# Patient Record
Sex: Male | Born: 1970 | Race: White | Hispanic: No | Marital: Married | Smoking: Never smoker
Health system: Southern US, Community
[De-identification: ages and names within clinical notes are randomized; demographics above are authoritative.]

## PROBLEM LIST (undated history)

## (undated) DIAGNOSIS — M25511 Pain in right shoulder: Secondary | ICD-10-CM

## (undated) DIAGNOSIS — G8929 Other chronic pain: Secondary | ICD-10-CM

## (undated) HISTORY — DX: Other chronic pain: G89.29

## (undated) HISTORY — PX: REPLACEMENT DISC ANTERIOR LUMBAR SPINE: SUR1215

## (undated) HISTORY — PX: LUMBAR DISC SURGERY: SHX700

## (undated) HISTORY — DX: Pain in right shoulder: M25.511

---

## 2016-08-29 ENCOUNTER — Ambulatory Visit (INDEPENDENT_AMBULATORY_CARE_PROVIDER_SITE_OTHER): Payer: PRIVATE HEALTH INSURANCE

## 2016-08-29 ENCOUNTER — Ambulatory Visit
Admission: EM | Admit: 2016-08-29 | Discharge: 2016-08-29 | Disposition: A | Discharge status: Home / Self Care | Payer: PRIVATE HEALTH INSURANCE | Attending: Family | Admitting: Family

## 2016-08-29 DIAGNOSIS — M546 Pain in thoracic spine: Secondary | ICD-10-CM

## 2016-08-29 DIAGNOSIS — R0789 Other chest pain: Secondary | ICD-10-CM | POA: Diagnosis not present

## 2016-08-29 MED ORDER — KETOROLAC TROMETHAMINE 60 MG/2ML IM SOLN
60.0000 mg | Freq: Once | INTRAMUSCULAR | Status: AC
  Administered 2016-08-29: 60 mg via INTRAMUSCULAR

## 2016-08-29 MED ORDER — NAPROXEN 500 MG PO TABS: 500.0000 mg | ORAL_TABLET | Freq: Two times a day (BID) | ORAL | 0 refills | Status: DC | PRN

## 2016-08-29 NOTE — ED Triage Notes (Addendum)
Pt with right chest pain and under scapula. Pain 7/10 and worse with movement and is positional. Denies associated sx. Denies recent illness or injury. Pain is sharp in nature and more tender to touch

## 2016-08-29 NOTE — ED Provider Notes (Signed)
CSN: [id number]     Arrival date & time 08/29/16  0831 History   First MD Initiated Contact with Patient 08/29/16 [id number]     Chief Complaint  Patient presents with  . Chest Pain  . Back Pain   (Consider location/radiation/quality/duration/timing/severity/associated sxs/prior Treatment) 46 year old male presents with right sided chest pain that is traveling to his back, just under his scapula. Pain started about 2 to 3 days ago and is getting worse. No distinct injury. Pain worse when moving or stretching area.  No fever, URI symptoms, cough or GI symptoms. Has not taken any medication for symptoms. No history of cardiac or lung disease. However, he does use smokeless tobacco. No other chronic health issues. Takes no daily medication.    The history is provided by the patient.    History reviewed. No pertinent past medical history. Past Surgical History:  Procedure Laterality Date  . LUMBAR DISC SURGERY     History reviewed. No pertinent family history. Social History  Substance Use Topics  . Smoking status: Never Smoker  . Smokeless tobacco: Current User    Types: Snuff  . Alcohol use Yes     Comment: social    Review of Systems  Constitutional: Negative for chills, fatigue and fever.  HENT: Negative for congestion and sore throat.   Eyes: Negative for visual disturbance.  Respiratory: Negative for cough, chest tightness, shortness of breath and wheezing.   Cardiovascular: Positive for chest pain. Negative for palpitations and leg swelling.  Gastrointestinal: Negative for abdominal pain, constipation, diarrhea, nausea and vomiting.  Genitourinary: Negative for difficulty urinating.  Musculoskeletal: Positive for back pain. Negative for arthralgias, joint swelling, neck pain and neck stiffness.  Skin: Negative for color change, rash and wound.  Neurological: Negative for dizziness, syncope, weakness, light-headedness, numbness and headaches.  Hematological: Negative for  adenopathy.    Allergies  Penicillins  Home Medications   Prior to Admission medications   Medication Sig Start Date End Date Taking? Authorizing Provider  naproxen (NAPROSYN) 500 MG tablet Take 1 tablet (500 mg total) by mouth 2 (two) times daily as needed for moderate pain. 08/29/16   Sudie Grumbling, NP   Meds Ordered and Administered this Visit   Medications  ketorolac (TORADOL) injection 60 mg (60 mg Intramuscular Given 08/29/16 1014)    BP (!) 156/89 (BP Location: Left Arm)   Pulse 71   Temp 97.7 F (36.5 C) (Oral)   Resp 18   Ht 6' (1.829 m)   Wt 230 lb (104.3 kg)   SpO2 99%   BMI 31.19 kg/m  No data found.   Physical Exam  Constitutional: He is oriented to person, place, and time. He appears well-developed and well-nourished. He does not appear ill. No distress.  HENT:  Head: Normocephalic and atraumatic.  Nose: Nose normal.  Mouth/Throat: Oropharynx is clear and moist.  Eyes: Conjunctivae and EOM are normal.  Neck: Normal range of motion. Neck supple.  Cardiovascular: Normal rate, regular rhythm and normal heart sounds.   No murmur heard. Pulmonary/Chest: Effort normal and breath sounds normal. No accessory muscle usage. No tachypnea. No respiratory distress. He has no decreased breath sounds. He has no wheezes. He has no rhonchi.    No tenderness to palpation. No rash or other lesions present. Pain does increase with movement and raising right arm and shoulder.   Musculoskeletal: Normal range of motion. He exhibits no tenderness.       Thoracic back: He exhibits pain. He exhibits  normal range of motion, no tenderness, no swelling, no edema, no deformity and no spasm.       Back:  Lymphadenopathy:    He has no cervical adenopathy.  Neurological: He is alert and oriented to person, place, and time. He has normal strength. No sensory deficit.  Skin: Skin is warm and dry. No rash noted. No erythema.  Psychiatric: He has a normal mood and affect. His behavior  is normal. Judgment and thought content normal.    Urgent Care Course     Procedures (including critical care time)  Labs Review Labs Reviewed - No data to display  Imaging Review Dg Chest 2 View  Result Date: 08/29/2016 CLINICAL DATA:  Right chest pain for 3 days. EXAM: CHEST  2 VIEW COMPARISON:  None. FINDINGS: Heart and mediastinal contours are within normal limits. No focal opacities or effusions. No acute bony abnormality. IMPRESSION: No active cardiopulmonary disease. Electronically Signed   By: Charlett NoseKevin  Dover M.D.   On: 08/29/2016 09:45     Visual Acuity Review  Right Eye Distance:   Left Eye Distance:   Bilateral Distance:    Right Eye Near:   Left Eye Near:    Bilateral Near:         MDM   1. Chest wall pain    Reviewed negative x-ray results with patient. Discussed performing an EKG but patient declines- minimal suspicion of cardiac cause of pain. Discussed that the pain is most likely musculoskeletal in origin, especially since pain increases with movement- will treat with anti-inflammatories. Recommend Toradol 60mg  IM now. May then take Naproxen 500mg  every 12 hours as needed. May apply warm compresses to area for comfort. Rest. Follow-up in 2 to 3 days if not improving or go to ER if pain worsens and any shortness of breath, dizziness, vision changes or increased fatigue occurs.    Sudie GrumblingAnn Berry Shevon Sian, NP 08/29/16 [id number]

## 2016-08-29 NOTE — Discharge Instructions (Signed)
You were given a shot of Toradol today to help with pain and inflammation. You may take Naproxen twice a day as needed for pain. Rest. May apply warm compresses to area for comfort. Recommend follow-up in 2 to 3 days if not improving or go to ER if pain worsens.

## 2016-09-03 ENCOUNTER — Ambulatory Visit (INDEPENDENT_AMBULATORY_CARE_PROVIDER_SITE_OTHER): Payer: PRIVATE HEALTH INSURANCE | Admitting: Family Medicine

## 2016-09-03 ENCOUNTER — Encounter: Payer: Self-pay | Admitting: Family Medicine

## 2016-09-03 VITALS — BP 120/88 | HR 80 | Ht 72.0 in | Wt 230.0 lb

## 2016-09-03 DIAGNOSIS — R5383 Other fatigue: Secondary | ICD-10-CM

## 2016-09-03 DIAGNOSIS — Z7689 Persons encountering health services in other specified circumstances: Secondary | ICD-10-CM

## 2016-09-03 DIAGNOSIS — R6882 Decreased libido: Secondary | ICD-10-CM | POA: Diagnosis not present

## 2016-09-03 DIAGNOSIS — R5381 Other malaise: Secondary | ICD-10-CM | POA: Diagnosis not present

## 2016-09-03 NOTE — Patient Instructions (Signed)

## 2016-09-03 NOTE — Progress Notes (Signed)
Name: Evan Krueger   MRN: [id number]    DOB: 02/22/1971   Date:09/03/2016       Progress Note  Subjective  Chief Complaint  Chief Complaint  Patient presents with  . Establish Care  . libido    low sex drive    Thyroid Problem  Presents for initial visit. Symptoms include anxiety and visual change. Patient reports no cold intolerance, constipation, depressed mood, diaphoresis, diarrhea, dry skin, fatigue, hair loss, heat intolerance, hoarse voice, leg swelling, nail problem, palpitations, tremors, weight gain or weight loss. There is no history of atrial fibrillation, diabetes, heart failure or neuropathy. Risk factors include family history of thyroid cancer, family history of goiter, family history of hyperthyroidism, family history of hypothyroidism and radiation exposure.    No problem-specific Assessment & Plan notes found for this encounter.   Past Medical History:  Diagnosis Date  . Chronic right shoulder pain     Past Surgical History:  Procedure Laterality Date  . LUMBAR DISC SURGERY    . REPLACEMENT DISC ANTERIOR LUMBAR SPINE     L5- S1    Family History  Problem Relation Age of Onset  . Heart disease Father   . Cancer Maternal Grandfather   . Cancer Paternal Grandfather     Social History   Social History  . Marital status: Married    Spouse name: N/A  . Number of children: N/A  . Years of education: N/A   Occupational History  . Not on file.   Social History Main Topics  . Smoking status: Never Smoker  . Smokeless tobacco: Current User    Types: Snuff  . Alcohol use Yes     Comment: social  . Drug use: No  . Sexual activity: Yes   Other Topics Concern  . Not on file   Social History Narrative  . No narrative on file    Allergies  Allergen Reactions  . Penicillins Swelling    Outpatient Medications Prior to Visit  Medication Sig Dispense Refill  . naproxen (NAPROSYN) 500 MG tablet Take 1 tablet (500 mg total) by mouth 2 (two) times  daily as needed for moderate pain. (Patient not taking: Reported on 09/03/2016) 20 tablet 0   No facility-administered medications prior to visit.     Review of Systems  Constitutional: Negative for chills, diaphoresis, fatigue, fever, malaise/fatigue, weight gain and weight loss.  HENT: Negative for congestion, ear discharge, ear pain, hearing loss, hoarse voice, nosebleeds, sinus pain, sore throat and tinnitus.   Eyes: Negative for blurred vision, double vision, photophobia, pain and discharge.  Respiratory: Negative for cough, hemoptysis, sputum production, shortness of breath, wheezing and stridor.   Cardiovascular: Negative for chest pain, palpitations, orthopnea, claudication, leg swelling and PND.  Gastrointestinal: Negative for abdominal pain, blood in stool, constipation, diarrhea, heartburn, melena, nausea and vomiting.  Genitourinary: Negative for dysuria, flank pain, frequency, hematuria and urgency.  Musculoskeletal: Negative for back pain, joint pain, myalgias and neck pain.  Skin: Positive for itching and rash.  Neurological: Negative for dizziness, tingling, tremors, sensory change, focal weakness, seizures, loss of consciousness, weakness and headaches.  Endo/Heme/Allergies: Negative for environmental allergies, cold intolerance, heat intolerance and polydipsia. Does not bruise/bleed easily.  Psychiatric/Behavioral: Negative for depression, memory loss and suicidal ideas. The patient is nervous/anxious. The patient does not have insomnia.        No decrease intrteast     Objective  Vitals:   09/03/16 1440  BP: 120/88  Pulse: 80  Weight: 230  lb (104.3 kg)  Height: 6' (1.829 m)    Physical Exam  Constitutional: He is oriented to person, place, and time and well-developed, well-nourished, and in no distress.  HENT:  Head: Normocephalic.  Right Ear: Tympanic membrane, external ear and ear canal normal.  Left Ear: Tympanic membrane, external ear and ear canal normal.    Nose: Nose normal.  Mouth/Throat: Oropharynx is clear and moist.  Eyes: Conjunctivae and EOM are normal. Pupils are equal, round, and reactive to light. Right eye exhibits no discharge. Left eye exhibits no discharge. No scleral icterus.  Neck: Normal range of motion. Neck supple. Normal carotid pulses, no hepatojugular reflux and no JVD present. Carotid bruit is not present. No tracheal deviation present. No thyroid mass and no thyromegaly present.  Cardiovascular: Normal rate, regular rhythm, normal heart sounds and intact distal pulses.  Exam reveals no gallop and no friction rub.   No murmur heard. Pulmonary/Chest: Breath sounds normal. No respiratory distress. He has no wheezes. He has no rales.  Abdominal: Soft. Bowel sounds are normal. He exhibits no mass. There is no hepatosplenomegaly. There is no tenderness. There is no rebound, no guarding and no CVA tenderness.  Musculoskeletal: Normal range of motion. He exhibits no edema or tenderness.  Lymphadenopathy:       Head (right side): No submandibular adenopathy present.       Head (left side): No submandibular adenopathy present.    He has no cervical adenopathy.    He has no axillary adenopathy.  Neurological: He is alert and oriented to person, place, and time. He has normal sensation, normal strength and intact cranial nerves. No cranial nerve deficit.  Skin: Skin is warm. Rash noted. Rash is macular.     Psychiatric: Mood and affect normal.  Nursing note and vitals reviewed.     Assessment & Plan  Problem List Items Addressed This Visit    None    Visit Diagnoses    Encounter to establish care    -  Primary   Malaise and fatigue       Relevant Orders   CBC   Renal Function Panel   Testosterone, Free, Total, SHBG   TSH   Decreased libido          No orders of the defined types were placed in this encounter.     Dr. Hayden Rasmusseneanna Marquesha Robideau [facility]  09/03/16

## 2016-09-04 LAB — CBC
HEMATOCRIT: 44 % (ref 37.5–51.0)
Hemoglobin: 15.2 g/dL (ref 13.0–17.7)
MCH: 31.1 pg (ref 26.6–33.0)
MCHC: 34.5 g/dL (ref 31.5–35.7)
MCV: 90 fL (ref 79–97)
Platelets: 196 10*3/uL (ref 150–379)
RBC: 4.89 x10E6/uL (ref 4.14–5.80)
RDW: 13.2 % (ref 12.3–15.4)
WBC: 8.5 10*3/uL (ref 3.4–10.8)

## 2016-09-04 LAB — RENAL FUNCTION PANEL
Albumin: 4.8 g/dL (ref 3.5–5.5)
BUN/Creatinine Ratio: 15 (ref 9–20)
BUN: 14 mg/dL (ref 6–24)
CO2: 25 mmol/L (ref 18–29)
CREATININE: 0.92 mg/dL (ref 0.76–1.27)
Calcium: 9.6 mg/dL (ref 8.7–10.2)
Chloride: 99 mmol/L (ref 96–106)
GFR calc Af Amer: 115 mL/min/{1.73_m2} (ref >59)
GFR, EST NON AFRICAN AMERICAN: 99 mL/min/{1.73_m2} (ref >59)
Glucose: 87 mg/dL (ref 65–99)
PHOSPHORUS: 3.4 mg/dL (ref 2.5–4.5)
Potassium: 4.1 mmol/L (ref 3.5–5.2)
SODIUM: 140 mmol/L (ref 134–144)

## 2016-09-04 LAB — TESTOSTERONE, FREE, TOTAL, SHBG
SEX HORMONE BINDING: 40.7 nmol/L (ref 16.5–55.9)
Testosterone, Free: 7.8 pg/mL (ref 6.8–21.5)
Testosterone: 318 ng/dL (ref 264–916)

## 2016-09-04 LAB — TSH: TSH: 1.35 u[IU]/mL (ref 0.450–4.500)

## 2016-09-19 ENCOUNTER — Other Ambulatory Visit: Payer: Self-pay

## 2016-09-20 ENCOUNTER — Telehealth: Payer: Self-pay | Admitting: *Deleted

## 2016-09-20 NOTE — Telephone Encounter (Signed)
Called pt to inform about his paperwork. Patient states he would like to be referred out to an orthopedics or physical therapy. Please advise. Thank you

## 2016-09-20 NOTE — Telephone Encounter (Signed)
Pt is going to come in so we can refer him out for chronic shoulder and back pain

## 2016-09-23 ENCOUNTER — Ambulatory Visit: Payer: PRIVATE HEALTH INSURANCE | Admitting: Family Medicine

## 2016-09-23 ENCOUNTER — Ambulatory Visit (INDEPENDENT_AMBULATORY_CARE_PROVIDER_SITE_OTHER): Payer: PRIVATE HEALTH INSURANCE | Admitting: Family Medicine

## 2016-09-23 ENCOUNTER — Encounter: Payer: Self-pay | Admitting: Family Medicine

## 2016-09-23 VITALS — BP 110/80 | HR 80 | Ht 72.0 in | Wt 231.0 lb

## 2016-09-23 DIAGNOSIS — M5137 Other intervertebral disc degeneration, lumbosacral region: Secondary | ICD-10-CM

## 2016-09-23 DIAGNOSIS — M25511 Pain in right shoulder: Secondary | ICD-10-CM | POA: Diagnosis not present

## 2016-09-23 DIAGNOSIS — M5136 Other intervertebral disc degeneration, lumbar region: Secondary | ICD-10-CM

## 2016-09-23 DIAGNOSIS — G8929 Other chronic pain: Secondary | ICD-10-CM | POA: Diagnosis not present

## 2016-09-23 MED ORDER — MELOXICAM 15 MG PO TABS: 15.0000 mg | ORAL_TABLET | Freq: Every day | ORAL | 0 refills | Status: DC

## 2016-09-23 NOTE — Patient Instructions (Signed)
Shoulder Impingement Syndrome Shoulder impingement syndrome is a condition that causes pain when connective tissues (tendons) surrounding the shoulder joint become pinched. These tendons are part of the group of muscles and tissues that help to stabilize the shoulder (rotator cuff). Beneath the rotator cuff is a fluid-filled sac (bursa) that allows the muscles and tendons to glide smoothly. The bursa may become swollen or irritated (bursitis). Bursitis, swelling in the rotator cuff tendons, or both conditions can decrease how much space is under a bone in the shoulder joint (acromion), resulting in impingement. What are the causes? Shoulder impingement syndrome can be caused by bursitis or swelling of the rotator cuff tendons, which may result from:  Repetitive overhead arm movements.  Falling onto the shoulder.  Weakness in the shoulder muscles. What increases the risk? You may be more likely to develop this condition if you are an athlete who participates in:  Sports that involve throwing, such as baseball.  Tennis.  Swimming.  Volleyball. Some people are also more likely to develop impingement syndrome because of the shape of their acromion bone. What are the signs or symptoms? The main symptom of this condition is pain on the front or side of the shoulder. Pain may:  Get worse when lifting or raising the arm.  Get worse at night.  Wake you up from sleeping.  Feel sharp when the shoulder is moved, and then fade to an ache. Other signs and symptoms may include:  Tenderness.  Stiffness.  Inability to raise the arm above shoulder level or behind the body.  Weakness. How is this diagnosed? This condition may be diagnosed based on:  Your symptoms.  Your medical history.  A physical exam.  Imaging tests, such as:  X-rays.  MRI.  Ultrasound. How is this treated? Treatment for this condition may include:  Resting your shoulder and avoiding all activities that  cause pain or put stress on the shoulder.  Icing your shoulder.  NSAIDs to help reduce pain and swelling.  One or more injections of medicines to numb the area and reduce inflammation.  Physical therapy.  Surgery. This may be needed if nonsurgical treatments have not helped. Surgery may involve repairing the rotator cuff, reshaping the acromion, or removing the bursa. Follow these instructions at home: Managing pain, stiffness, and swelling   If directed, apply ice to the injured area.  Put ice in a plastic bag.  Place a towel between your skin and the bag.  Leave the ice on for 20 minutes, 2-3 times a day. Activity   Rest and return to your normal activities as told by your health care provider. Ask your health care provider what activities are safe for you.  Do exercises as told by your health care provider. General instructions   Do not use any tobacco products, including cigarettes, chewing tobacco, or e-cigarettes. Tobacco can delay healing. If you need help quitting, ask your health care provider.  Ask your health care provider when it is safe for you to drive.  Take over-the-counter and prescription medicines only as told by your health care provider.  Keep all follow-up visits as told by your health care provider. This is important. How is this prevented?  Give your body time to rest between periods of activity.  Be safe and responsible while being active to avoid falls.  Maintain physical fitness, including strength and flexibility. Contact a health care provider if:  Your symptoms have not improved after 1-2 months of treatment and rest.  You   cannot lift your arm away from your body. This information is not intended to replace advice given to you by your health care provider. Make sure you discuss any questions you have with your health care provider. Document Released: 05/27/2005 Document Revised: 02/01/2016 Document Reviewed: 04/29/2015 Elsevier  Interactive Patient Education  2017 [employer]  

## 2016-09-23 NOTE — Progress Notes (Signed)
Name: Evan Krueger   MRN: [id number]    DOB: 1971/03/14   Date:09/23/2016       Progress Note  Subjective  Chief Complaint  Chief Complaint  Patient presents with  . Shoulder Pain    R) shoulder pain- hurts to raise shoulder to 90 degrees- "thought it was a torn rotator cuff"  . Back Pain    chronic lower back pain- "stiffness occurs once a month and have to get up and move around"    Shoulder Pain   The pain is present in the right shoulder. This is a chronic problem. The current episode started more than 1 year ago. There has been a history of trauma. The problem occurs intermittently. The problem has been waxing and waning. The quality of the pain is described as aching. The pain is moderate. Associated symptoms include a limited range of motion and stiffness. Pertinent negatives include no fever, inability to bear weight, joint swelling, numbness or tingling. The symptoms are aggravated by activity. He has tried NSAIDS (TENS) for the symptoms. The treatment provided moderate (temporary relieved) relief.  Back Pain  This is a chronic (surg 2008) problem. The current episode started more than 1 year ago. The problem occurs daily. The problem is unchanged. The pain is present in the lumbar spine (L5-S1). The quality of the pain is described as aching. The pain does not radiate. The pain is at a severity of 4/10. The pain is moderate. The pain is worse during the day. The symptoms are aggravated by bending and twisting (lifting). Pertinent negatives include no abdominal pain, bladder incontinence, bowel incontinence, chest pain, dysuria, fever, headaches, leg pain, numbness, paresis, paresthesias, tingling, weakness or weight loss. He has tried NSAIDs (surgery/2008) for the symptoms. The treatment provided moderate relief.    No problem-specific Assessment & Plan notes found for this encounter.   Past Medical History:  Diagnosis Date  . Chronic right shoulder pain     Past Surgical  History:  Procedure Laterality Date  . LUMBAR DISC SURGERY    . REPLACEMENT DISC ANTERIOR LUMBAR SPINE     L5- S1    Family History  Problem Relation Age of Onset  . Heart disease Father   . Cancer Maternal Grandfather   . Cancer Paternal Grandfather     Social History   Social History  . Marital status: Married    Spouse name: N/A  . Number of children: N/A  . Years of education: N/A   Occupational History  . Not on file.   Social History Main Topics  . Smoking status: Never Smoker  . Smokeless tobacco: Current User    Types: Snuff  . Alcohol use Yes     Comment: social  . Drug use: No  . Sexual activity: Yes   Other Topics Concern  . Not on file   Social History Narrative  . No narrative on file    Allergies  Allergen Reactions  . Penicillins Swelling    Outpatient Medications Prior to Visit  Medication Sig Dispense Refill  . albuterol (PROVENTIL HFA;VENTOLIN HFA) 108 (90 Base) MCG/ACT inhaler Inhale 1 puff into the lungs as needed.    . gabapentin (NEURONTIN) 800 MG tablet Take 800 mg by mouth 2 (two) times daily. Pt taking daily not BID    . naproxen (NAPROSYN) 500 MG tablet Take 1 tablet (500 mg total) by mouth 2 (two) times daily as needed for moderate pain. (Patient not taking: Reported on 09/03/2016) 20 tablet 0  No facility-administered medications prior to visit.     Review of Systems  Constitutional: Negative for chills, fever, malaise/fatigue and weight loss.  HENT: Negative for ear discharge, ear pain and sore throat.   Eyes: Negative for blurred vision.  Respiratory: Negative for cough, sputum production, shortness of breath and wheezing.   Cardiovascular: Negative for chest pain, palpitations and leg swelling.  Gastrointestinal: Negative for abdominal pain, blood in stool, bowel incontinence, constipation, diarrhea, heartburn, melena and nausea.  Genitourinary: Negative for bladder incontinence, dysuria, frequency, hematuria and urgency.    Musculoskeletal: Positive for back pain and stiffness. Negative for joint pain, myalgias and neck pain.  Skin: Negative for rash.  Neurological: Negative for dizziness, tingling, sensory change, focal weakness, weakness, numbness, headaches and paresthesias.  Endo/Heme/Allergies: Negative for environmental allergies and polydipsia. Does not bruise/bleed easily.  Psychiatric/Behavioral: Negative for depression and suicidal ideas. The patient is not nervous/anxious and does not have insomnia.      Objective  Vitals:   09/23/16 1003  BP: 110/80  Pulse: 80  Weight: 231 lb (104.8 kg)  Height: 6' (1.829 m)    Physical Exam  Constitutional: He is oriented to person, place, and time and well-developed, well-nourished, and in no distress.  HENT:  Head: Normocephalic.  Right Ear: External ear normal.  Left Ear: External ear normal.  Nose: Nose normal.  Mouth/Throat: Oropharynx is clear and moist.  Eyes: Conjunctivae and EOM are normal. Pupils are equal, round, and reactive to light. Right eye exhibits no discharge. Left eye exhibits no discharge. No scleral icterus.  Neck: Normal range of motion. Neck supple. No JVD present. No tracheal deviation present. No thyromegaly present.  Cardiovascular: Normal rate, regular rhythm, normal heart sounds and intact distal pulses.  Exam reveals no gallop and no friction rub.   No murmur heard. Pulmonary/Chest: Breath sounds normal. No respiratory distress. He has no wheezes. He has no rales.  Abdominal: Soft. Bowel sounds are normal. He exhibits no mass. There is no hepatosplenomegaly. There is no tenderness. There is no rebound, no guarding and no CVA tenderness.  Musculoskeletal: He exhibits no edema.       Right shoulder: He exhibits decreased range of motion and tenderness.       Lumbar back: He exhibits spasm.       Back:  Lymphadenopathy:    He has no cervical adenopathy.  Neurological: He is alert and oriented to person, place, and time. He  has normal sensation, normal strength, normal reflexes and intact cranial nerves. No cranial nerve deficit.  Skin: Skin is warm. No rash noted.  Psychiatric: Mood and affect normal.  Nursing note and vitals reviewed.     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 meloxicam (MOBIC) 15 MG tablet    Sig: Take 1 tablet (15 mg total) by mouth daily.    Dispense:  30 tablet    Refill:  0  Cannot fill out disability papers- will send to specialist for further evaluation   Dr. Elizabeth Sauer [facility]  09/23/16

## 2017-06-17 ENCOUNTER — Ambulatory Visit (INDEPENDENT_AMBULATORY_CARE_PROVIDER_SITE_OTHER): Payer: PRIVATE HEALTH INSURANCE | Admitting: Family Medicine

## 2017-06-17 ENCOUNTER — Encounter: Payer: Self-pay | Admitting: Family Medicine

## 2017-06-17 VITALS — BP 120/90 | HR 102 | Temp 98.3°F | Ht 72.0 in | Wt 230.0 lb

## 2017-06-17 DIAGNOSIS — M5136 Other intervertebral disc degeneration, lumbar region: Secondary | ICD-10-CM

## 2017-06-17 DIAGNOSIS — M5137 Other intervertebral disc degeneration, lumbosacral region: Secondary | ICD-10-CM

## 2017-06-17 DIAGNOSIS — R350 Frequency of micturition: Secondary | ICD-10-CM | POA: Diagnosis not present

## 2017-06-17 DIAGNOSIS — S46819A Strain of other muscles, fascia and tendons at shoulder and upper arm level, unspecified arm, initial encounter: Secondary | ICD-10-CM

## 2017-06-17 DIAGNOSIS — J0100 Acute maxillary sinusitis, unspecified: Secondary | ICD-10-CM | POA: Diagnosis not present

## 2017-06-17 DIAGNOSIS — M51379 Other intervertebral disc degeneration, lumbosacral region without mention of lumbar back pain or lower extremity pain: Secondary | ICD-10-CM

## 2017-06-17 LAB — POCT URINALYSIS DIPSTICK
Bilirubin, UA: NEGATIVE
GLUCOSE UA: NEGATIVE
KETONES UA: NEGATIVE
Leukocytes, UA: NEGATIVE
Nitrite, UA: NEGATIVE
SPEC GRAV UA: 1.015 (ref 1.010–1.025)
Urobilinogen, UA: 0.2 E.U./dL
pH, UA: 6 (ref 5.0–8.0)

## 2017-06-17 MED ORDER — CYCLOBENZAPRINE HCL 10 MG PO TABS: 10.0000 mg | ORAL_TABLET | Freq: Three times a day (TID) | ORAL | 0 refills | Status: AC | PRN

## 2017-06-17 MED ORDER — MELOXICAM 15 MG PO TABS: 15.0000 mg | ORAL_TABLET | Freq: Every day | ORAL | 0 refills | Status: AC

## 2017-06-17 MED ORDER — AZITHROMYCIN 250 MG PO TABS: ORAL_TABLET | ORAL | 0 refills | Status: DC

## 2017-06-17 NOTE — Progress Notes (Signed)
Name: Evan Krueger   MRN: [id number]    DOB: 1970/07/25   Date:06/17/2017       Progress Note  Subjective  Chief Complaint  Chief Complaint  Patient presents with  . Urinary Tract Infection    lower back pain, urinary frequency  . Sinusitis    not able to eat, sinus cong and cough- has had green production, but is now clearing up    Urinary Tract Infection   This is a new problem. The current episode started in the past 7 days. The problem has been waxing and waning. The quality of the pain is described as aching. The pain is mild. There has been no fever. Associated symptoms include chills. Pertinent negatives include no discharge, flank pain, frequency, hematuria, hesitancy, nausea, sweats, urgency or vomiting. The treatment provided mild relief.  Sinusitis  This is a new problem. The current episode started 1 to 4 weeks ago. The maximum temperature recorded prior to his arrival was 100.4 - 100.9 F. Associated symptoms include chills, congestion, coughing, diaphoresis, headaches and sinus pressure. Pertinent negatives include no ear pain, neck pain, shortness of breath or sore throat. Past treatments include nothing.    No problem-specific Assessment & Plan notes found for this encounter.   Past Medical History:  Diagnosis Date  . Chronic right shoulder pain     Past Surgical History:  Procedure Laterality Date  . LUMBAR DISC SURGERY    . REPLACEMENT DISC ANTERIOR LUMBAR SPINE     L5- S1    Family History  Problem Relation Age of Onset  . Heart disease Father   . Cancer Maternal Grandfather   . Cancer Paternal Grandfather     Social History   Socioeconomic History  . Marital status: Married    Spouse name: Not on file  . Number of children: Not on file  . Years of education: Not on file  . Highest education level: Not on file  Social Needs  . Financial resource strain: Not on file  . Food insecurity - worry: Not on file  . Food insecurity - inability: Not on file   . Transportation needs - medical: Not on file  . Transportation needs - non-medical: Not on file  Occupational History  . Not on file  Tobacco Use  . Smoking status: Never Smoker  . Smokeless tobacco: Current User    Types: Snuff  Substance and Sexual Activity  . Alcohol use: Yes    Comment: social  . Drug use: No  . Sexual activity: Yes  Other Topics Concern  . Not on file  Social History Narrative  . Not on file    Allergies  Allergen Reactions  . Penicillins Swelling    Outpatient Medications Prior to Visit  Medication Sig Dispense Refill  . albuterol (PROVENTIL HFA;VENTOLIN HFA) 108 (90 Base) MCG/ACT inhaler Inhale 1 puff into the lungs as needed.    . gabapentin (NEURONTIN) 800 MG tablet Take 800 mg by mouth 2 (two) times daily. Pt taking daily not BID    . meloxicam (MOBIC) 15 MG tablet Take 1 tablet (15 mg total) by mouth daily. 30 tablet 0   No facility-administered medications prior to visit.     Review of Systems  Constitutional: Positive for chills and diaphoresis. Negative for fever, malaise/fatigue and weight loss.  HENT: Positive for congestion and sinus pressure. Negative for ear discharge, ear pain and sore throat.   Eyes: Negative for blurred vision.  Respiratory: Positive for cough. Negative for  sputum production, shortness of breath and wheezing.   Cardiovascular: Negative for chest pain, palpitations and leg swelling.  Gastrointestinal: Negative for abdominal pain, blood in stool, constipation, diarrhea, heartburn, melena, nausea and vomiting.  Genitourinary: Negative for dysuria, flank pain, frequency, hematuria, hesitancy and urgency.  Musculoskeletal: Negative for back pain, joint pain, myalgias and neck pain.  Skin: Negative for rash.  Neurological: Positive for headaches. Negative for dizziness, tingling, sensory change and focal weakness.  Endo/Heme/Allergies: Negative for environmental allergies and polydipsia. Does not bruise/bleed easily.   Psychiatric/Behavioral: Negative for depression and suicidal ideas. The patient is not nervous/anxious and does not have insomnia.      Objective  Vitals:   06/17/17 1103  BP: 120/90  Pulse: (!) 102  Temp: 98.3 F (36.8 C)  TempSrc: Oral  Weight: 230 lb (104.3 kg)  Height: 6' (1.829 m)    Physical Exam  Constitutional: He is oriented to person, place, and time and well-developed, well-nourished, and in no distress.  HENT:  Head: Normocephalic.  Right Ear: External ear normal.  Left Ear: External ear normal.  Nose: Nose normal.  Mouth/Throat: Oropharynx is clear and moist.  Eyes: Conjunctivae and EOM are normal. Pupils are equal, round, and reactive to light. Right eye exhibits no discharge. Left eye exhibits no discharge. No scleral icterus.  Neck: Normal range of motion. Neck supple. No JVD present. No tracheal deviation present. No thyromegaly present.  Cardiovascular: Normal rate, regular rhythm, normal heart sounds and intact distal pulses. Exam reveals no gallop and no friction rub.  No murmur heard. Pulmonary/Chest: Breath sounds normal. No respiratory distress. He has no wheezes. He has no rales.  Abdominal: Soft. Bowel sounds are normal. He exhibits no mass. There is no hepatosplenomegaly. There is no tenderness. There is no rebound, no guarding and no CVA tenderness.  Musculoskeletal: Normal range of motion. He exhibits no edema or tenderness.  Lymphadenopathy:    He has no cervical adenopathy.  Neurological: He is alert and oriented to person, place, and time. He has normal sensation, normal strength and intact cranial nerves. No cranial nerve deficit.  Skin: Skin is warm. No rash noted.  Psychiatric: Mood and affect normal.  Nursing note and vitals reviewed.     Assessment & Plan  Problem List Items Addressed This Visit    None    Visit Diagnoses    Degeneration of intervertebral disc at L5-S1 level    -  Primary   hx of surgery   Relevant Medications    meloxicam (MOBIC) 15 MG tablet   cyclobenzaprine (FLEXERIL) 10 MG tablet   Acute maxillary sinusitis, recurrence not specified       Relevant Medications   azithromycin (ZITHROMAX) 250 MG tablet   Strain of trapezius muscle, unspecified laterality, initial encounter       Relevant Medications   cyclobenzaprine (FLEXERIL) 10 MG tablet   Frequent urination       Relevant Orders   POCT Urinalysis Dipstick (Completed)      Meds ordered this encounter  Medications  . meloxicam (MOBIC) 15 MG tablet    Sig: Take 1 tablet (15 mg total) by mouth daily.    Dispense:  30 tablet    Refill:  0  . cyclobenzaprine (FLEXERIL) 10 MG tablet    Sig: Take 1 tablet (10 mg total) by mouth 3 (three) times daily as needed for muscle spasms.    Dispense:  30 tablet    Refill:  0  . azithromycin (ZITHROMAX) 250 MG  tablet    Sig: 2 today then 1 a day for 4 days    Dispense:  6 tablet    Refill:  0      Dr. Hayden [facility]  06/17/17

## 2018-06-25 ENCOUNTER — Encounter: Payer: Self-pay | Admitting: Family Medicine

## 2018-06-25 ENCOUNTER — Ambulatory Visit (INDEPENDENT_AMBULATORY_CARE_PROVIDER_SITE_OTHER): Payer: PRIVATE HEALTH INSURANCE | Admitting: Family Medicine

## 2018-06-25 VITALS — BP 128/81 | HR 84 | Resp 16 | Ht 74.0 in | Wt 236.0 lb

## 2018-06-25 DIAGNOSIS — M7712 Lateral epicondylitis, left elbow: Secondary | ICD-10-CM | POA: Diagnosis not present

## 2018-06-25 DIAGNOSIS — M7711 Lateral epicondylitis, right elbow: Secondary | ICD-10-CM

## 2018-06-25 MED ORDER — MELOXICAM 15 MG PO TABS: 15.0000 mg | ORAL_TABLET | Freq: Every day | ORAL | 0 refills | Status: DC

## 2018-06-25 MED ORDER — PREDNISONE 10 MG PO TABS: ORAL_TABLET | ORAL | 1 refills | Status: AC

## 2018-06-25 NOTE — Progress Notes (Signed)
    Date:  06/25/2018   Name:  Evan Krueger   DOB:  06/11/1970   MRN:  [id number]   Chief Complaint: Arm Pain (2 months-Hurts L and R arms at elbow when picking things up and ROM affected. )  Arm Pain   Incident onset: 2 months. Injury mechanism: repetitive. The pain is present in the left elbow and right elbow. The quality of the pain is described as aching and shooting. The pain does not radiate. The pain is at a severity of 10/10. The pain is severe. The pain has been worsening since the incident. The symptoms are aggravated by lifting (dorsiflexion). He has tried NSAIDs for the symptoms. The treatment provided moderate relief.    Review of Systems  There are no active problems to display for this patient.   Allergies  Allergen Reactions  . Penicillins Swelling    Past Surgical History:  Procedure Laterality Date  . LUMBAR DISC SURGERY    . REPLACEMENT DISC ANTERIOR LUMBAR SPINE     L5- S1    Social History   Tobacco Use  . Smoking status: Never Smoker  . Smokeless tobacco: Current User    Types: Snuff  Substance Use Topics  . Alcohol use: Yes    Comment: social  . Drug use: No     Medication list has been reviewed and updated.  Current Meds  Medication Sig  . albuterol (PROVENTIL HFA;VENTOLIN HFA) 108 (90 Base) MCG/ACT inhaler Inhale 1 puff into the lungs as needed.  . cyclobenzaprine (FLEXERIL) 10 MG tablet Take 1 tablet (10 mg total) by mouth 3 (three) times daily as needed for muscle spasms.  Marland Kitchen gabapentin (NEURONTIN) 800 MG tablet Take 800 mg by mouth 2 (two) times daily. Pt taking daily not BID  . meloxicam (MOBIC) 15 MG tablet Take 1 tablet (15 mg total) by mouth daily.    PHQ 2/9 Scores 06/25/2018 06/17/2017  PHQ - 2 Score 0 0  PHQ- 9 Score 0 0    Physical Exam  BP 128/81   Pulse 84   Resp 16   Ht 6\' 2"  (1.88 m)   Wt 236 lb (107 kg)   SpO2 100%   BMI 30.30 kg/m   Assessment and Plan: 1. Lateral epicondylitis of both elbows Patient does  repetitive typing.  Over the past 2 months has developed bilateral lateral epicondylitis.  Will initiate meloxicam 15 mg once a day information given with the possibility of needed referral to orthopedics if does not resolve. - meloxicam (MOBIC) 15 MG tablet; Take 1 tablet (15 mg total) by mouth daily.  Dispense: 30 tablet; Refill: 0

## 2018-06-25 NOTE — Patient Instructions (Signed)
Tennis Elbow    Tennis elbow (lateral epicondylitis) is inflammation of tendons in your outer forearm, near your elbow. Tendons are tissues that connect muscle to bone. When you have tennis elbow, inflammation affects the tendons that you use to bend your wrist and move your hand up. Inflammation occurs in the lower part of the upper arm bone (humerus), where the tendons connect to the bone (lateral epicondyle).  Tennis elbow often affects people who play tennis, but anyone may get the condition from repeatedly extending the wrist or turning the forearm.  What are the causes?  This condition is usually caused by repeatedly extending the wrist, turning the forearm, and using the hands. It can result from sports or work that requires repetitive forearm movements. In some cases, it may be caused by a sudden injury.  What increases the risk?  You are more likely to develop tennis elbow if you play tennis or another racket sport. You also have a higher risk if you frequently use your hands for work. Besides people who play tennis, others at greater risk include:  Musicians.  Carpenters, painters, and plumbers.  Cooks.  Cashiers.  People who work in factories.  Construction workers.  Butchers.  People who use computers.  What are the signs or symptoms?  Symptoms of this condition include:  Pain and tenderness in the forearm and the outer part of the elbow. Pain may be felt only when using the arm, or it may be there all the time.  A burning feeling that starts in the elbow and spreads down the forearm.  A weak grip in the hand.  How is this diagnosed?  This condition may be diagnosed based on:  Your symptoms and medical history.  A physical exam.  X-rays.  MRI.  How is this treated?  Resting and icing your arm is often the first treatment. Your health care provider may also recommend:  Medicines to reduce pain and inflammation. These may be in the form of a pill, topical gels, or shots of a steroid medicine  (cortisone).  An elbow strap to reduce stress on the area.  Physical therapy. This may include massage or exercises.  An elbow brace to restrict the movements that cause symptoms.  If these treatments do not help relieve your symptoms, your health care provider may recommend surgery to remove damaged muscle and reattach healthy muscle to bone.  Follow these instructions at home:  Activity  Rest your elbow and wrist and avoid activities that cause symptoms, as told by your health care provider.  Do physical therapy exercises as instructed.  If you lift an object, lift it with your palm facing up. This reduces stress on your elbow.  Lifestyle  If your tennis elbow is caused by sports, check your equipment and make sure that:  You are using it correctly.  It is the best fit for you.  If your tennis elbow is caused by work or computer use, take frequent breaks to stretch your arm. Talk with your manager about ways to manage your condition at work.  If you have a brace:  Wear the brace or strap as told by your health care provider. Remove it only as told by your health care provider.  Loosen the brace if your fingers tingle, become numb, or turn cold and blue.  Keep the brace clean.  If the brace is not waterproof, ask if you may remove it for bathing. If you must keep the brace on while bathing:    Do not let it get wet.  Cover it with a watertight covering when you take a bath or a shower.  General instructions    If directed, put ice on the painful area:  Put ice in a plastic bag.  Place a towel between your skin and the bag.  Leave the ice on for 20 minutes, 2-3 times a day.  Take over-the-counter and prescription medicines only as told by your health care provider.  Keep all follow-up visits as told by your health care provider. This is important.  Contact a health care provider if:  You have pain that gets worse or does not get better with treatment.  You have numbness or weakness in your forearm, hand, or  fingers.  Summary  Tennis elbow (lateral epicondylitis) is inflammation of tendons in your outer forearm, near your elbow.  Common symptoms include pain and tenderness in your forearm and the outer part of your elbow.  This condition is usually caused by repeatedly extending your wrist, turning your forearm, and using your hands.  The first treatment is often resting and icing your arm to relieve symptoms. Further treatment may include taking medicine, getting physical therapy, wearing a brace or strap, or having surgery.  This information is not intended to replace advice given to you by your health care provider. Make sure you discuss any questions you have with your health care provider.  Document Released: 05/27/2005 Document Revised: 03/11/2017 Document Reviewed: 03/11/2017  [employer]vier Interactive Patient Education  2019 [employer]

## 2018-07-18 ENCOUNTER — Other Ambulatory Visit: Payer: Self-pay | Admitting: Family Medicine

## 2018-07-18 DIAGNOSIS — M7712 Lateral epicondylitis, left elbow: Principal | ICD-10-CM

## 2018-07-18 DIAGNOSIS — M7711 Lateral epicondylitis, right elbow: Secondary | ICD-10-CM

## 2018-08-13 IMAGING — CR DG CHEST 2V
2 series · 2 of 2 positions shown · non-contrast
Comparison: None.

CLINICAL DATA: Right chest pain for 3 days.

EXAM:
CHEST  2 VIEW

[chest pa]
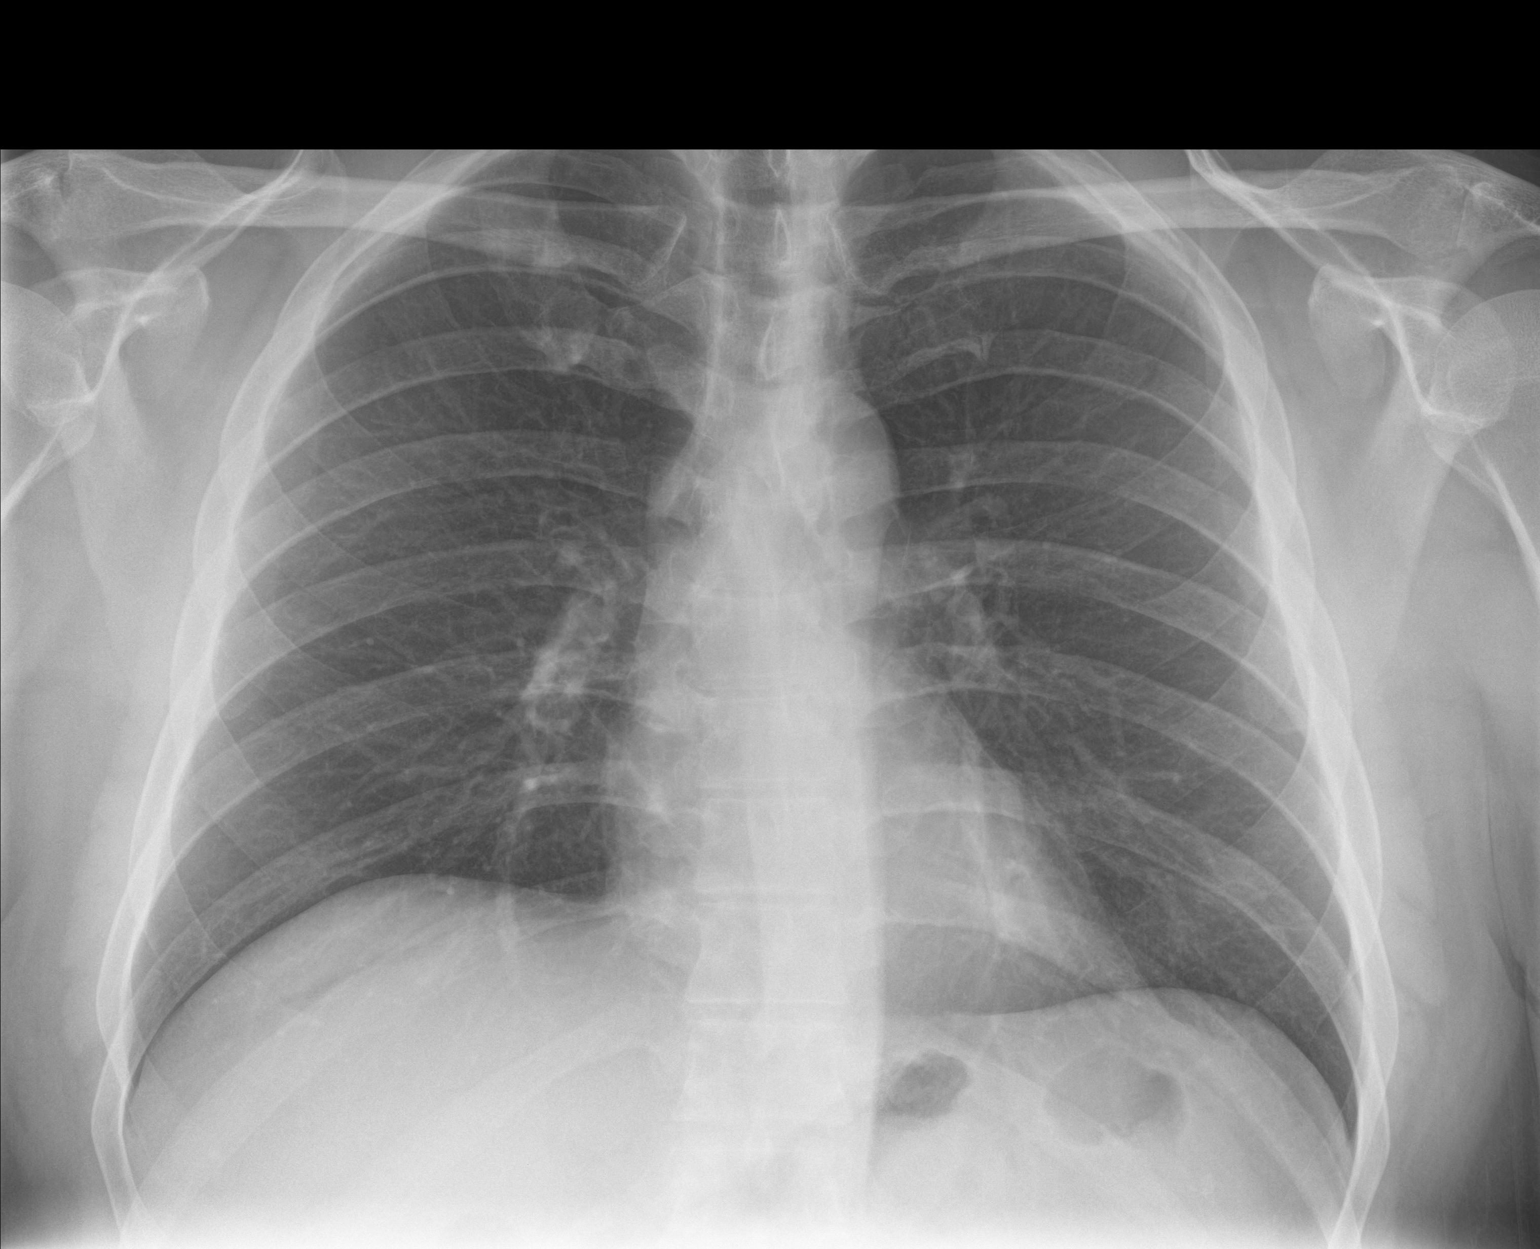

[chest lat]
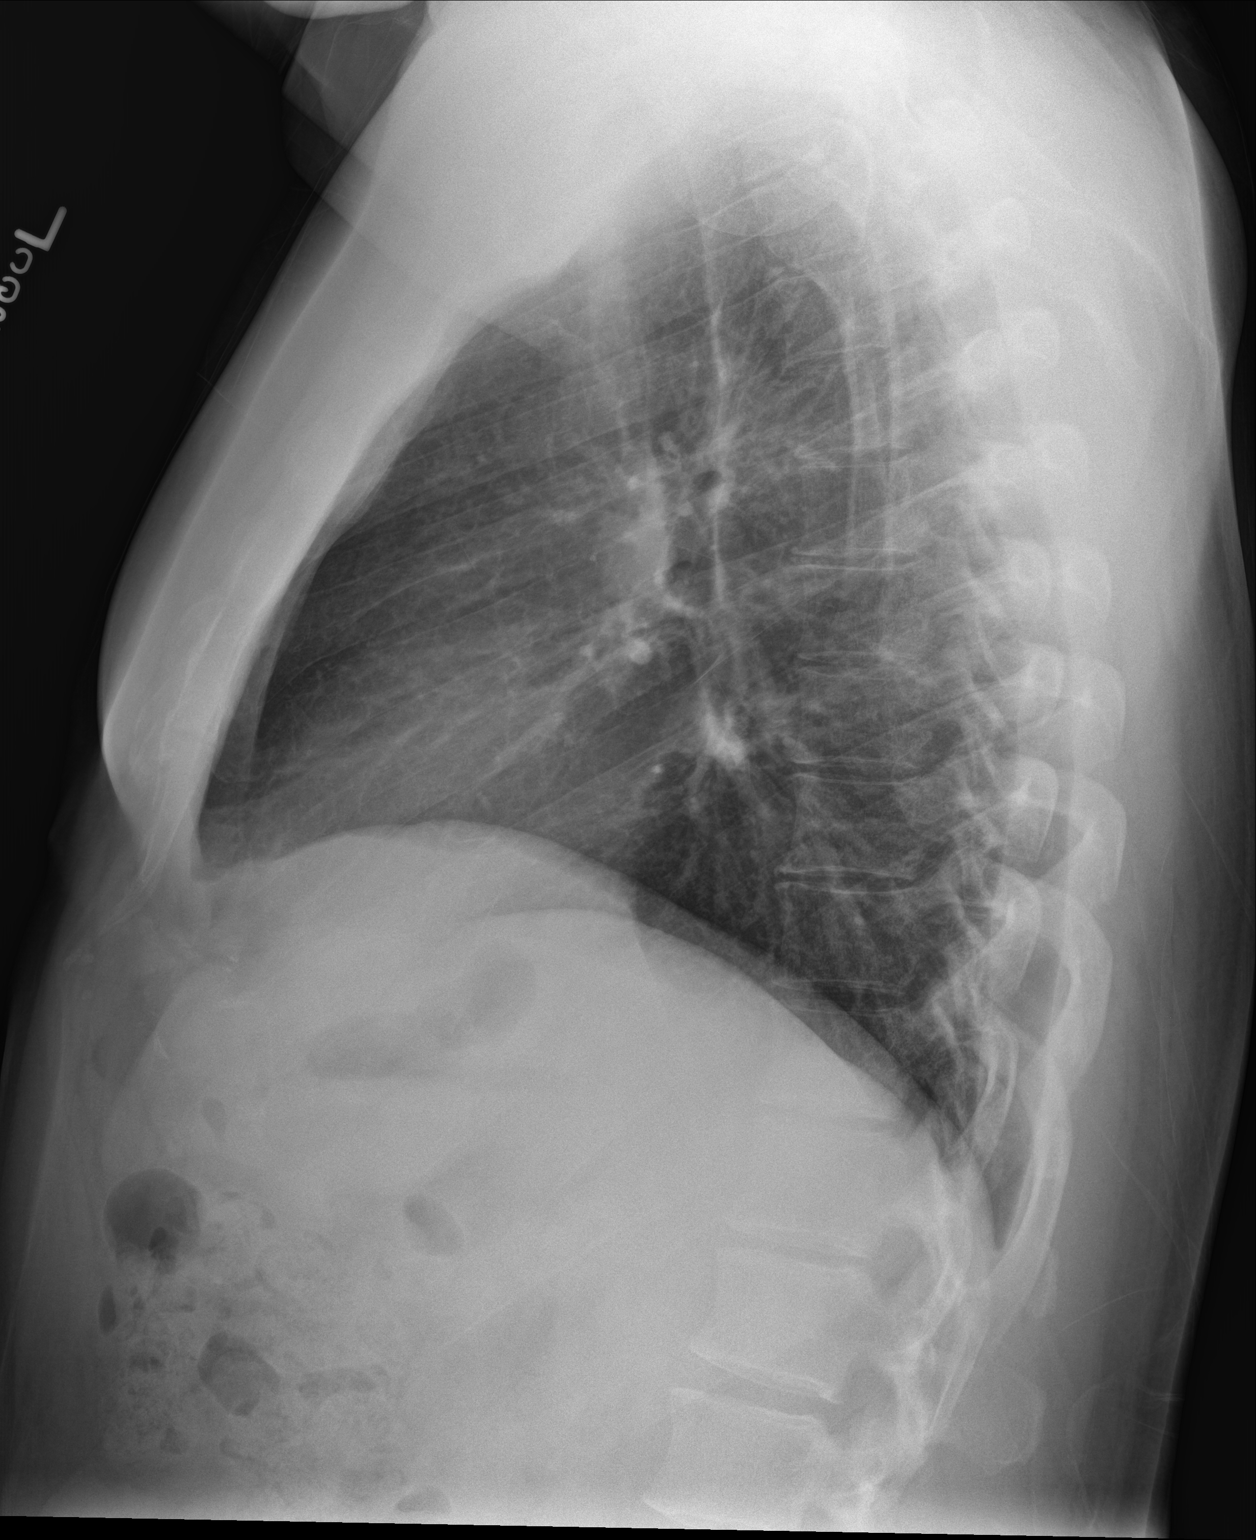

[2 of 2 positions shown; findings below may reference images not displayed]

FINDINGS: Heart and mediastinal contours are within normal limits. No focal
opacities or effusions. No acute bony abnormality.
IMPRESSION: No active cardiopulmonary disease.
